# Patient Record
Sex: Male | Born: 2005 | Race: Black or African American | Hispanic: No | Marital: Single | Smoking: Never smoker
Health system: Southern US, Community
[De-identification: ages and names within clinical notes are randomized; demographics above are authoritative.]

## PROBLEM LIST (undated history)

## (undated) DIAGNOSIS — R011 Cardiac murmur, unspecified: Secondary | ICD-10-CM

## (undated) DIAGNOSIS — F909 Attention-deficit hyperactivity disorder, unspecified type: Secondary | ICD-10-CM

---
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
## 2024-07-20 ENCOUNTER — Encounter (HOSPITAL_COMMUNITY): Payer: Self-pay

## 2024-07-20 ENCOUNTER — Ambulatory Visit (INDEPENDENT_AMBULATORY_CARE_PROVIDER_SITE_OTHER)

## 2024-07-20 ENCOUNTER — Other Ambulatory Visit: Payer: Self-pay

## 2024-07-20 ENCOUNTER — Ambulatory Visit (HOSPITAL_COMMUNITY)
Admission: EM | Admit: 2024-07-20 | Discharge: 2024-07-20 | Disposition: A | Discharge status: Home / Self Care | Attending: Family Medicine | Admitting: Family Medicine

## 2024-07-20 DIAGNOSIS — R0602 Shortness of breath: Secondary | ICD-10-CM | POA: Diagnosis not present

## 2024-07-20 DIAGNOSIS — R079 Chest pain, unspecified: Secondary | ICD-10-CM | POA: Insufficient documentation

## 2024-07-20 DIAGNOSIS — R5383 Other fatigue: Secondary | ICD-10-CM | POA: Insufficient documentation

## 2024-07-20 LAB — CBC
HCT: 42.2 % (ref 39.0–52.0)
Hemoglobin: 14.6 g/dL (ref 13.0–17.0)
MCH: 28.2 pg (ref 26.0–34.0)
MCHC: 34.6 g/dL (ref 30.0–36.0)
MCV: 81.5 fL (ref 80.0–100.0)
Platelets: 297 K/uL (ref 150–400)
RBC: 5.18 MIL/uL (ref 4.22–5.81)
RDW: 11.7 % (ref 11.5–15.5)
WBC: 3.8 K/uL — ABNORMAL LOW (ref 4.0–10.5)
nRBC: 0 % (ref 0.0–0.2)

## 2024-07-20 LAB — TSH: TSH: 2.522 u[IU]/mL (ref 0.350–4.500)

## 2024-07-20 MED ORDER — METHOCARBAMOL 500 MG PO TABS: 1000.0000 mg | ORAL_TABLET | Freq: Two times a day (BID) | ORAL | 0 refills | Status: DC

## 2024-07-20 NOTE — ED Triage Notes (Signed)
 Pt c/o poking sensation to lt back and chest x2wks. Denies pain or sensation now. States he has lost weight recently and just not feeling well in general. Denies taken any meds.

## 2024-07-20 NOTE — Discharge Instructions (Signed)
 You have been seen at the [facility] today for chest pain. Your evaluation today was not suggestive of any emergent condition requiring medical intervention at this time. Your ECG (heart tracing) did not show any worrisome changes. However, some medical problems make take more time to appear. Therefore, it's very important that you pay attention to any new symptoms or worsening of your current condition.  Please proceed directly to the Emergency Department immediately should you feel worse in any way or have any of the following symptoms: increasing or different chest pain, pain that spreads to your arm, neck, jaw, back or abdomen, more frequent feeling of shortness of breath, or nausea and vomiting.  You have had labs (blood work) sent today. We will call you with any significant abnormalities or if there is need to begin or change treatment or pursue further follow up.  You may also review your test results online through MyChart. If you do not have a MyChart account, instructions to sign up should be on your discharge paperwork.

## 2024-07-20 NOTE — ED Provider Notes (Signed)
 [facility]   [id number] 07/20/24 Arrival Time: 1054  ASSESSMENT & PLAN:  1. Chest pain, unspecified type   2. Other fatigue   3. SOB (shortness of breath) on exertion    Unclear cause. Likely MSK etiology of CP he describes.  ECG: Performed today and interpreted by me: normal EKG, normal sinus rhythm.  I have personally viewed the imaging studies ordered this visit. CXR: no acute changes; no pneumothorax.  Trial of: Meds ordered this encounter  Medications   methocarbamol (ROBAXIN) 500 MG tablet    Sig: Take 2 tablets (1,000 mg total) by mouth 2 (two) times daily.    Dispense:  28 tablet    Refill:  0   Pending:   CBC  TSH   F/U appt arranged with Dr Vita. New pt.  Reviewed expectations re: course of current medical issues. Questions answered. Outlined signs and symptoms indicating need for more acute intervention. Patient verbalized understanding. After Visit Summary given.   SUBJECTIVE:  History from: patient and mother. Stanley Baker is a 18 y.o. male who presents with complaint of on/off L-sided CP; x 2 weeks; denies injury/trauma. Sometimes related to certain movements; does not wake him at night. Feeling fatigued over past couple of weeks. Denies n/v.  Social History   Tobacco Use  Smoking Status Never  Smokeless Tobacco Never   Social History   Substance and Sexual Activity  Alcohol Use Never  Occas THC use; smoking.   OBJECTIVE:  Vitals:   07/20/24 1129  BP: (!) 142/97  Pulse: 96  Resp: 16  Temp: 98.5 F (36.9 C)  TempSrc: Oral  SpO2: 100%    General appearance: alert, oriented, no acute distress Eyes: PERRLA; EOMI; conjunctivae normal HENT: normocephalic; atraumatic Neck: supple with FROM Lungs: without labored respirations; speaks full sentences without difficulty; CTAB Heart: regular rate and rhythm without murmer Chest Wall: without tenderness to palpation Abdomen: soft, non-tender; no guarding or rebound  tenderness Extremities: without edema; without calf swelling or tenderness; symmetrical without gross deformities Skin: warm and dry; without rash or lesions Neuro: normal gait Psychological: alert and cooperative; normal mood and affect    Imaging: DG Chest 2 View Result Date: 07/20/2024 EXAM: 2 VIEW(S) XRAY OF THE CHEST 07/20/2024 11:49:08 AM COMPARISON: 07/16/2023 CLINICAL HISTORY: CP FINDINGS: LUNGS AND PLEURA: No focal pulmonary opacity. No pulmonary edema. No pleural effusion. No pneumothorax. HEART AND MEDIASTINUM: No acute abnormality of the cardiac and mediastinal silhouettes. BONES AND SOFT TISSUES: No acute osseous abnormality. IMPRESSION: 1. No acute cardiopulmonary process. Electronically signed by: Waddell Calk MD 07/20/2024 12:55 PM EST RP Workstation: [id number]     No Known Allergies  Past Medical History:  Diagnosis Date   ADHD (attention deficit hyperactivity disorder)    Heart murmur    Social History   Socioeconomic History   Marital status: Single    Spouse name: Not on file   Number of children: Not on file   Years of education: Not on file   Highest education level: Not on file  Occupational History   Not on file  Tobacco Use   Smoking status: Never   Smokeless tobacco: Never  Vaping Use   Vaping status: Never Used  Substance and Sexual Activity   Alcohol use: Never   Drug use: Never   Sexual activity: Not on file  Other Topics Concern   Not on file  Social History Narrative   Not on file   Social Drivers of Corporate Investment Banker  Strain: Not on file  Food Insecurity: Not on file  Transportation Needs: Not on file  Physical Activity: Not on file  Stress: Not on file  Social Connections: Not on file  Intimate Partner Violence: Not on file   History reviewed. No pertinent family history. Past Surgical History:  Procedure Laterality Date   BUNIONECTOMY  06/08/2019   OPEN REDUCTION INTERNAL FIXATION (ORIF) DISTAL RADIAL FRACTURE Left  07/02/2021   Procedure: OPEN REDUCTION INTERNAL FIXATION (ORIF) DISTAL RADIAL FRACTURE;  Surgeon: Cristy Bonner DASEN, MD;  Location: [facility];  Service: Orthopedics;  Laterality: Left;      Rolinda Rogue, MD 07/20/24 1355

## 2024-07-21 ENCOUNTER — Ambulatory Visit (HOSPITAL_COMMUNITY): Payer: Self-pay

## 2024-07-24 ENCOUNTER — Ambulatory Visit (HOSPITAL_COMMUNITY)
Admission: EM | Admit: 2024-07-24 | Discharge: 2024-07-24 | Disposition: A | Discharge status: Home / Self Care | Attending: Physician Assistant | Admitting: Physician Assistant

## 2024-07-24 ENCOUNTER — Other Ambulatory Visit: Payer: Self-pay

## 2024-07-24 ENCOUNTER — Encounter (HOSPITAL_COMMUNITY): Payer: Self-pay | Admitting: *Deleted

## 2024-07-24 DIAGNOSIS — F411 Generalized anxiety disorder: Secondary | ICD-10-CM | POA: Insufficient documentation

## 2024-07-24 DIAGNOSIS — R0789 Other chest pain: Secondary | ICD-10-CM | POA: Insufficient documentation

## 2024-07-24 LAB — COMPREHENSIVE METABOLIC PANEL WITH GFR
ALT: 13 U/L (ref 0–44)
AST: 20 U/L (ref 15–41)
Albumin: 4.7 g/dL (ref 3.5–5.0)
Alkaline Phosphatase: 86 U/L (ref 38–126)
Anion gap: 17 — ABNORMAL HIGH (ref 5–15)
BUN: 9 mg/dL (ref 6–20)
CO2: 24 mmol/L (ref 22–32)
Calcium: 9.6 mg/dL (ref 8.9–10.3)
Chloride: 101 mmol/L (ref 98–111)
Creatinine, Ser: 0.98 mg/dL (ref 0.61–1.24)
GFR, Estimated: >60 mL/min (ref >60)
Glucose, Bld: 91 mg/dL (ref 70–99)
Potassium: 3.7 mmol/L (ref 3.5–5.1)
Sodium: 142 mmol/L (ref 135–145)
Total Bilirubin: 0.7 mg/dL (ref 0.0–1.2)
Total Protein: 7.8 g/dL (ref 6.5–8.1)

## 2024-07-24 LAB — CBC WITH DIFFERENTIAL/PLATELET
Abs Immature Granulocytes: 0 K/uL (ref 0.00–0.07)
Basophils Absolute: 0.1 K/uL (ref 0.0–0.1)
Basophils Relative: 1 %
Eosinophils Absolute: 0.5 K/uL (ref 0.0–0.5)
Eosinophils Relative: 10 %
HCT: 43.9 % (ref 39.0–52.0)
Hemoglobin: 15.5 g/dL (ref 13.0–17.0)
Immature Granulocytes: 0 %
Lymphocytes Relative: 31 %
Lymphs Abs: 1.5 K/uL (ref 0.7–4.0)
MCH: 28.7 pg (ref 26.0–34.0)
MCHC: 35.3 g/dL (ref 30.0–36.0)
MCV: 81.3 fL (ref 80.0–100.0)
Monocytes Absolute: 0.5 K/uL (ref 0.1–1.0)
Monocytes Relative: 10 %
Neutro Abs: 2.2 K/uL (ref 1.7–7.7)
Neutrophils Relative %: 48 %
Platelets: 373 K/uL (ref 150–400)
RBC: 5.4 MIL/uL (ref 4.22–5.81)
RDW: 11.9 % (ref 11.5–15.5)
WBC: 4.7 K/uL (ref 4.0–10.5)
nRBC: 0 % (ref 0.0–0.2)

## 2024-07-24 MED ORDER — HYDROXYZINE HCL 25 MG PO TABS: 25.0000 mg | ORAL_TABLET | Freq: Three times a day (TID) | ORAL | 0 refills | Status: DC | PRN

## 2024-07-24 NOTE — ED Triage Notes (Signed)
 Pt states he has SOB and chest pain no change since last visit. He is taking robaxin but states it helps sometimes. Pt denies any sx in clinic today.

## 2024-07-24 NOTE — ED Notes (Signed)
 PCP appt 08/08/2024

## 2024-07-24 NOTE — Discharge Instructions (Addendum)
 VISIT SUMMARY:  You came in today with concerns about chest pain, shortness of breath, and palpitations. We discussed your symptoms in detail and explored their possible connection to anxiety. Your physical examination, including an EKG and lung sounds, was normal. We talked about different treatment options to help manage your anxiety and alleviate your symptoms.  YOUR PLAN:  -GENERALIZED ANXIETY DISORDER WITH ASSOCIATED PALPITATIONS, CHEST PAIN, SHORTNESS OF BREATH, AND FATIGUE: Generalized anxiety disorder is a condition characterized by excessive worry and anxiety about various aspects of life. Your symptoms of palpitations, chest pain, shortness of breath, and fatigue are likely related to this condition and are exacerbated by stress. We discussed starting a medication like an SSRI for long-term management of anxiety, and you were prescribed hydroxyzine for immediate relief. Counseling or therapy was also recommended to help address the underlying anxiety.  INSTRUCTIONS:  Please follow up with your primary care provider for ongoing management of your anxiety. Make sure to get the blood work done to check your electrolytes, liver, and kidney function. Consider starting counseling or therapy services to help manage your anxiety more effectively. If you have any questions or concerns, do not hesitate to reach out.

## 2024-07-24 NOTE — ED Provider Notes (Signed)
 [facility]    CSN: [id number] Arrival date & time: 07/24/24  1552      History   Chief Complaint Chief Complaint  Patient presents with   Shortness of Breath   Chest Pain    HPI Stanley Baker is a 18 y.o. male.  has a past medical history of ADHD (attention deficit hyperactivity disorder) and Heart murmur.   HPI  Discussed the use of AI scribe software for clinical note transcription with the patient, who gave verbal consent to proceed.  He presents with chest pain, shortness of breath, and palpitations. He is here with his mother who is assisting with HPI  He experiences chest pain localized to the front of his chest, sometimes radiating to his back, particularly when lying down. The pain is described as a 'little poke' and disrupts his sleep. It improves when lying down. No heartburn or symptoms worsening with food intake.  He has episodes of shortness of breath, described as difficulty getting enough air, occurring intermittently and resolving spontaneously. No lightheadedness or headaches are associated with these episodes.  He experiences daily episodes of palpitations, particularly when standing up, even without exertion. Stress seems to trigger these symptoms. He has no previous history of similar symptoms.  He mentions a recent episode of back pain, initially thought to be a pulled muscle, occurring about two to three weeks ago, which has persisted despite rest. He has been taking methocarbamol (Robaxin) but initially did not take it correctly. It helps to calm him down when taken properly but does not seem to last long.  He has been experiencing increased stress recently and acknowledges feeling anxious, especially when thinking about his health. Sleep is the only thing that alleviates his anxiety. He is a very active person who has recently stopped engaging in activities like riding his bike and playing ball due to his symptoms.   Past Medical History:   Diagnosis Date   ADHD (attention deficit hyperactivity disorder)    Heart murmur     Patient Active Problem List   Diagnosis Date Noted   Cardiac murmur 03/30/2019    Past Surgical History:  Procedure Laterality Date   BUNIONECTOMY  06/08/2019   OPEN REDUCTION INTERNAL FIXATION (ORIF) DISTAL RADIAL FRACTURE Left 07/02/2021   Procedure: OPEN REDUCTION INTERNAL FIXATION (ORIF) DISTAL RADIAL FRACTURE;  Surgeon: Cristy Bonner DASEN, MD;  Location: [facility];  Service: Orthopedics;  Laterality: Left;       Home Medications    Prior to Admission medications   Medication Sig Start Date End Date Taking? Authorizing Provider  hydrOXYzine (ATARAX) 25 MG tablet Take 1 tablet (25 mg total) by mouth every 8 (eight) hours as needed. 07/24/24  Yes Rhoda Waldvogel E, PA-C  methocarbamol (ROBAXIN) 500 MG tablet Take 2 tablets (1,000 mg total) by mouth 2 (two) times daily. 07/20/24  Yes Rolinda Rogue, MD    Family History History reviewed. No pertinent family history.  Social History Social History   Tobacco Use   Smoking status: Never   Smokeless tobacco: Never  Vaping Use   Vaping status: Former  Substance Use Topics   Alcohol use: Never   Drug use: Yes    Types: Marijuana     Allergies   Patient has no known allergies.   Review of Systems Review of Systems  Constitutional:  Positive for diaphoresis and fatigue.  Respiratory:  Positive for chest tightness and shortness of breath. Negative for wheezing.   Cardiovascular:  Positive for  chest pain and palpitations.  Psychiatric/Behavioral:  The patient is nervous/anxious.      Physical Exam Triage Vital Signs ED Triage Vitals  Encounter Vitals Group     BP 07/24/24 1732 (!) 143/82     Girls Systolic BP Percentile --      Girls Diastolic BP Percentile --      Boys Systolic BP Percentile --      Boys Diastolic BP Percentile --      Pulse Rate 07/24/24 1732 100     Resp 07/24/24 1732 16     Temp 07/24/24 1732  98.8 F (37.1 C)     Temp Source 07/24/24 1732 Oral     SpO2 07/24/24 1732 96 %     Weight --      Height --      Head Circumference --      Peak Flow --      Pain Score 07/24/24 1731 0     Pain Loc --      Pain Education --      Exclude from Growth Chart --    No data found.  Updated Vital Signs BP (!) 143/82 (BP Location: Left Arm)   Pulse 100   Temp 98.8 F (37.1 C) (Oral)   Resp 16   SpO2 96%   Visual Acuity Right Eye Distance:   Left Eye Distance:   Bilateral Distance:    Right Eye Near:   Left Eye Near:    Bilateral Near:     Physical Exam Vitals reviewed.  Constitutional:      General: He is awake.     Appearance: Normal appearance. He is well-developed and well-groomed.  HENT:     Head: Normocephalic and atraumatic.  Eyes:     Extraocular Movements: Extraocular movements intact.     Conjunctiva/sclera: Conjunctivae normal.  Cardiovascular:     Rate and Rhythm: Normal rate and regular rhythm.     Pulses:          Radial pulses are 2+ on the right side and 2+ on the left side.     Heart sounds: Normal heart sounds. No murmur heard.    No friction rub. No gallop.  Pulmonary:     Effort: Pulmonary effort is normal.     Breath sounds: Normal breath sounds. No decreased air movement. No decreased breath sounds, wheezing, rhonchi or rales.  Musculoskeletal:     Cervical back: Normal range of motion.  Neurological:     Mental Status: He is alert and oriented to person, place, and time.  Psychiatric:        Attention and Perception: Attention normal.        Mood and Affect: Mood normal.        Speech: Speech normal.        Behavior: Behavior normal. Behavior is cooperative.      UC Treatments / Results  Labs (all labs ordered are listed, but only abnormal results are displayed) Labs Reviewed  COMPREHENSIVE METABOLIC PANEL WITH GFR  CBC WITH DIFFERENTIAL/PLATELET    EKG   Radiology No results found.  Procedures ED EKG  Date/Time:  07/24/2024 6:07 PM  Performed by: Marylene Rocky BRAVO, PA-C Authorized by: Marylene Rocky BRAVO, PA-C   Previous ECG:    Previous ECG:  Compared to current   Similarity:  Changes noted   Comparison ECG info:  07/20/24- new Twave inversion in V1 Interpretation:    Interpretation: normal   Rate:    ECG  rate:  91   ECG rate assessment: normal   Rhythm:    Rhythm: sinus rhythm   ST segments:    Details:  Diffuse elevations in lead 3, aVF, V1, V2 - suspect this is from significant QRS waves and repolarization T waves:    T waves: inverted     Details:  V1  (including critical care time)  Medications Ordered in UC Medications - No data to display  Initial Impression / Assessment and Plan / UC Course  I have reviewed the triage vital signs and the nursing notes.  Pertinent labs & imaging results that were available during my care of the patient were reviewed by me and considered in my medical decision making (see chart for details).    Patient was previously seen at this urgent care on 07/20/2024 for chest pain, fatigue, shortness of breath.  EKG and chest x-ray were performed at that time which were both reassuring with no evidence of abnormality or concerning findings.  TSH and CBC were largely within normal limits.  Patient was prescribed methocarbamol 1000 mg p.o. twice daily to assist with symptoms.  Cardiology follow-up appointment was also set up for patient for 08/08/2024.  Final Clinical Impressions(s) / UC Diagnoses   Final diagnoses:  Other chest pain  Anxiety state   Generalized anxiety disorder with associated palpitations, chest pain, shortness of breath, and fatigue Intermittent palpitations, chest pain, shortness of breath, and fatigue likely related to generalized anxiety disorder. Symptoms exacerbated by stress and improve slightly with methocarbamol. Normal EKG and clear lung sounds. Differential includes panic attacks, heart murmur, undiagnosed asthma . Anxiety may be  contributing to symptoms. Discussed potential benefits of starting an SSRI or SNRI for long-term management, emphasizing the importance of consistent medication management to avoid withdrawal symptoms. Hydroxyzine offered for acute anxiety relief. Counseling or therapy recommended to address underlying anxiety and improve treatment efficacy. - Ordered blood work to check electrolytes, liver, and kidney function. - Prescribed hydroxyzine for acute anxiety relief. - Provided education materials on anxiety medications. - Advised follow-up with primary care for ongoing management of anxiety. - Discussed potential initiation of SSRI for long-term anxiety management. - Recommended counseling or therapy services to augment medication treatment.    Discharge Instructions      VISIT SUMMARY:  You came in today with concerns about chest pain, shortness of breath, and palpitations. We discussed your symptoms in detail and explored their possible connection to anxiety. Your physical examination, including an EKG and lung sounds, was normal. We talked about different treatment options to help manage your anxiety and alleviate your symptoms.  YOUR PLAN:  -GENERALIZED ANXIETY DISORDER WITH ASSOCIATED PALPITATIONS, CHEST PAIN, SHORTNESS OF BREATH, AND FATIGUE: Generalized anxiety disorder is a condition characterized by excessive worry and anxiety about various aspects of life. Your symptoms of palpitations, chest pain, shortness of breath, and fatigue are likely related to this condition and are exacerbated by stress. We discussed starting a medication like an SSRI for long-term management of anxiety, and you were prescribed hydroxyzine for immediate relief. Counseling or therapy was also recommended to help address the underlying anxiety.  INSTRUCTIONS:  Please follow up with your primary care provider for ongoing management of your anxiety. Make sure to get the blood work done to check your electrolytes,  liver, and kidney function. Consider starting counseling or therapy services to help manage your anxiety more effectively. If you have any questions or concerns, do not hesitate to reach out.  ED Prescriptions     Medication Sig Dispense Auth. Provider   hydrOXYzine (ATARAX) 25 MG tablet Take 1 tablet (25 mg total) by mouth every 8 (eight) hours as needed. 30 tablet Kani Jobson E, PA-C      PDMP not reviewed this encounter.   Marylene Rocky BRAVO, PA-C 07/24/24 1955

## 2024-07-25 ENCOUNTER — Ambulatory Visit (HOSPITAL_COMMUNITY): Payer: Self-pay

## 2024-08-08 ENCOUNTER — Ambulatory Visit (INDEPENDENT_AMBULATORY_CARE_PROVIDER_SITE_OTHER): Admitting: Family Medicine

## 2024-08-08 ENCOUNTER — Encounter: Payer: Self-pay | Admitting: Family Medicine

## 2024-08-08 VITALS — BP 128/76 | HR 96 | Ht 74.0 in | Wt 147.2 lb

## 2024-08-08 DIAGNOSIS — F1210 Cannabis abuse, uncomplicated: Secondary | ICD-10-CM | POA: Diagnosis not present

## 2024-08-08 DIAGNOSIS — F419 Anxiety disorder, unspecified: Secondary | ICD-10-CM

## 2024-08-08 NOTE — Progress Notes (Signed)
 Name: Stanley Baker   Date of Visit: 08/08/24   Date of last visit with me: Visit date not found   CHIEF COMPLAINT:  Chief Complaint  Patient presents with   Establish Care    New patient to est care. Was seen at [facility] and referred here. Would like to discuss anxiety.        HPI:  Discussed the use of AI scribe software for clinical note transcription with the patient, who gave verbal consent to proceed.  History of Present Illness   Stanley Baker is a 18 year old male who presents with episodes of chest pain and anxiety, possibly related to marijuana use. He is accompanied by his mother, Junel.  He experiences episodes of chest pain and anxiety, described as 'possible panic attacks,' which have occurred twice and were associated with marijuana use. He stopped using marijuana approximately 29 days ago and has noticed an improvement in his symptoms since cessation.  He was prescribed hydroxyzine  for these episodes, which he took as directed. However, the medication caused excessive sleepiness and a lack of energy, making him feel like a 'zombie.' He has not found the medication helpful and has not experienced anxiety symptoms since stopping marijuana.  He mentions experiencing weight loss, noting a decrease from 162 pounds to 147 pounds over an unspecified period. He attributes this to the medication and possibly changes in appetite after stopping marijuana. He maintains his usual diet and activity level, although he is not currently exercising regularly.  He has stopped using marijuana and is not currently using any other substances. He was working and attending school.         OBJECTIVE:       08/08/2024   11:11 AM  Depression screen PHQ 2/9  Decreased Interest 0  Down, Depressed, Hopeless 0  PHQ - 2 Score 0     BP Readings from Last 3 Encounters:  08/08/24 128/76  07/24/24 (!) 143/82  07/20/24 (!) 142/97    BP 128/76   Pulse 96   Ht 6' 2 (1.88 m)   Wt 147 lb  3.2 oz (66.8 kg)   BMI 18.90 kg/m    Physical Exam   MEASUREMENTS: Weight- 147.      Physical Exam Constitutional:      Appearance: Normal appearance.  Neurological:     General: No focal deficit present.     Mental Status: He is alert and oriented to person, place, and time. Mental status is at baseline.     ASSESSMENT/PLAN:   Assessment & Plan Marijuana abuse  Anxiety    Assessment and Plan    Panic attacks likely substance-induced Panic attacks likely induced by cannabis use. Symptoms resolved after cessation. No evidence of generalized anxiety disorder. Hydroxyzine  was ineffective and caused sedation. - Discontinued hydroxyzine . - Advised against marijuana use due to its potential to induce anxiety and psych issues. - Previous notes reviewed from 07/24/24 ad 07/20/2024 - Discussed need need for stopping substance abuse.   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 No current use. Symptom improvement noted. - Continue abstinence from marijuana.  Mild weight loss likely related to lifestyle change and substance cessation Mild weight loss likely due to cessation of marijuana use. No significant dietary changes. Weight loss not concerning. - Monitor weight.      Total time spent on the date of the encounter was 34 minutes, which included reviewing the patient's chart, performing a history and physical exam,  ordering and reviewing studies, coordinating care, and counseling the patient regarding diagnosis and treatment options. The time spent was medically necessary and supports billing based on total time.    Guillermina Shaft A. Vita MD [facility]

## 2024-08-30 ENCOUNTER — Ambulatory Visit: Admitting: Family Medicine

## 2024-08-30 ENCOUNTER — Encounter: Payer: Self-pay | Admitting: Family Medicine

## 2024-08-30 VITALS — BP 112/68 | HR 96 | Ht 74.0 in | Wt 151.6 lb

## 2024-08-30 DIAGNOSIS — F419 Anxiety disorder, unspecified: Secondary | ICD-10-CM | POA: Diagnosis not present

## 2024-08-30 DIAGNOSIS — F1210 Cannabis abuse, uncomplicated: Secondary | ICD-10-CM

## 2024-08-30 DIAGNOSIS — Z0000 Encounter for general adult medical examination without abnormal findings: Secondary | ICD-10-CM

## 2024-08-30 NOTE — Progress Notes (Signed)
° °  Name: Stanley Baker   Date of Visit: 08/30/2024   Date of last visit with me: 08/08/2024   CHIEF COMPLAINT:  Chief Complaint  Patient presents with   Annual Exam    Cpe.         HPI:  Discussed the use of AI scribe software for clinical note transcription with the patient, who gave verbal consent to proceed.  History of Present Illness   Stanley Baker is a 18 year old male who presents for a routine follow-up visit.  He feels better since his last visit with a noted decrease in anxiety. However, he experiences a lack of motivation and interest in daily activities, which he attributes to both a lack of energy and interest.  He has a history of low hemoglobin noted a year ago, but recent labs indicate improvement. He has had multiple blood draws in the past, including two during a hospital visit. He is not interested in having blood drawn at this visit.  He is not currently exercising, citing a lack of motivation and energy. He used to exercise daily but stopped after becoming ill. He has not been engaging in physical activities since then.  He is up to date on most vaccines, including the HPV vaccine, which he received around age 63. He is unsure about the meningococcal vaccine. He is not currently working and is considering returning to work as he feels better.         OBJECTIVE:       08/30/2024   11:17 AM  Depression screen PHQ 2/9  Decreased Interest 0  Down, Depressed, Hopeless 0  PHQ - 2 Score 0     BP Readings from Last 3 Encounters:  08/30/24 112/68  08/08/24 128/76  07/24/24 (!) 143/82    BP 112/68   Pulse 96   Ht 6' 2 (1.88 m)   Wt 151 lb 9.6 oz (68.8 kg)   SpO2 98%   BMI 19.46 kg/m    Physical Exam          Physical Exam Constitutional:      Appearance: Normal appearance.  Neurological:     General: No focal deficit present.     Mental Status: He is alert and oriented to person, place, and time. Mental status is at baseline.      ASSESSMENT/PLAN:   Assessment & Plan Annual physical exam  Marijuana abuse  Anxiety    Assessment and Plan    Adult Wellness Visit Routine visit with no significant issues. Blood pressure, weight normal. Hemoglobin improved. Vaccinations mostly up to date. Meningococcal vaccine not needed. HPV vaccination completed. - Obtain vaccination records to confirm meningococcal vaccine status. - Perform HIV and hepatitis C screening at next year's physical. - Encouraged regular exercise, two full-body workouts per week. -Comprehensive annual physical exam completed today. Reviewed interval history, current medical issues, medications, allergies, and preventive care needs. Addressed all patient questions and concerns. Discussed lifestyle factors including diet, exercise, sleep, and stress management. Reviewed recommended age-appropriate screenings, labs, and vaccinations. Counseling provided on healthy habits and routine health maintenance. Follow-up as indicated based on findings and results.  Adjustment-related mood changes Mood changes with lack of interest and decreased energy likely due to recent illness and life transition. No depression requiring medication. Emphasized self-discipline and activity reintroduction. - Encouraged self-discipline in daily activities to improve mood and energy.         Camora Tremain A. Vita MD [facility]

## 2025-08-31 ENCOUNTER — Encounter: Admitting: Family Medicine
